# Patient Record
Sex: Female | Born: 2000 | Race: White | Hispanic: No | Marital: Single | Smoking: Never smoker
Health system: Southern US, Community
[De-identification: ages and names within clinical notes are randomized; demographics above are authoritative.]

## PROBLEM LIST (undated history)

## (undated) DIAGNOSIS — J309 Allergic rhinitis, unspecified: Secondary | ICD-10-CM

## (undated) HISTORY — DX: Allergic rhinitis, unspecified: J30.9

---
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
## 2023-01-31 ENCOUNTER — Ambulatory Visit (INDEPENDENT_AMBULATORY_CARE_PROVIDER_SITE_OTHER): Payer: 59 | Admitting: Registered Nurse

## 2023-01-31 ENCOUNTER — Encounter: Payer: Self-pay | Admitting: Registered Nurse

## 2023-01-31 ENCOUNTER — Other Ambulatory Visit: Payer: Self-pay

## 2023-01-31 VITALS — BP 110/72 | HR 73 | Temp 98.8°F | Ht 65.75 in | Wt 186.0 lb

## 2023-01-31 DIAGNOSIS — H6993 Unspecified Eustachian tube disorder, bilateral: Secondary | ICD-10-CM

## 2023-01-31 DIAGNOSIS — J00 Acute nasopharyngitis [common cold]: Secondary | ICD-10-CM | POA: Diagnosis not present

## 2023-01-31 MED ORDER — FLUTICASONE PROPIONATE 50 MCG/ACT NA SUSP: 1.0000 | Freq: Two times a day (BID) | NASAL | 0 refills | Status: DC

## 2023-01-31 MED ORDER — PSEUDOEPHEDRINE HCL 30 MG PO TABS: 30.0000 mg | ORAL_TABLET | ORAL | 0 refills | Status: DC | PRN

## 2023-01-31 MED ORDER — SALINE SPRAY 0.65 % NA SOLN: 2.0000 | NASAL | 0 refills | Status: DC

## 2023-01-31 NOTE — Progress Notes (Signed)
Subjective:    Patient ID: Natalie Santos, female    DOB: 21-Oct-2001, 22 y.o.   MRN: [id number]  22y/o new caucasian single female here for evaluation 1 week ear fullness stopped popping a couple days ago history of PE tubes has fallen out and recurrent ear infections; denied discharge/fever/chills. Augment upset stomach.  Taking zyrtec am and xyzal pm for allergies.  Still having post nasal drip.  Has not started nose sprays grew up in [place], [place] gets seasonal allergies.      Review of Systems  Constitutional:  Negative for chills, fatigue and fever.  HENT:  Positive for congestion, postnasal drip, rhinorrhea and sinus pressure. Negative for dental problem, drooling, ear discharge, ear pain, facial swelling, hearing loss, sinus pain, sneezing, sore throat, tinnitus, trouble swallowing and voice change.   Eyes:  Negative for photophobia, pain, discharge, redness, itching and visual disturbance.  Respiratory:  Negative for cough, shortness of breath, wheezing and stridor.   Cardiovascular:  Negative for chest pain.  Gastrointestinal:  Negative for diarrhea, nausea and vomiting.  Genitourinary:  Negative for difficulty urinating.  Musculoskeletal:  Negative for back pain, gait problem, neck pain and neck stiffness.  Skin:  Negative for rash.  Allergic/Immunologic: Positive for environmental allergies.  Neurological:  Negative for dizziness, tremors, seizures, syncope, facial asymmetry, speech difficulty, weakness, light-headedness, numbness and headaches.  Psychiatric/Behavioral:  Negative for agitation, confusion and sleep disturbance.        Objective:   Physical Exam Vitals and nursing note reviewed.  Constitutional:      General: She is not in acute distress.    Appearance: Normal appearance. She is well-developed. She is not ill-appearing, toxic-appearing or diaphoretic.  HENT:     Head: Normocephalic and atraumatic.     Jaw: There is normal jaw occlusion.     Salivary  Glands: Right salivary gland is not diffusely enlarged or tender. Left salivary gland is not diffusely enlarged or tender.     Right Ear: Hearing, ear canal and external ear normal. No decreased hearing noted. No laceration, drainage, swelling or tenderness. A middle ear effusion is present. There is no impacted cerumen. No foreign body. No mastoid tenderness. No PE tube. No hemotympanum. Tympanic membrane is not injected, scarred, perforated, erythematous, retracted or bulging.     Left Ear: Hearing, ear canal and external ear normal. No decreased hearing noted. No laceration, drainage, swelling or tenderness. A middle ear effusion is present. There is no impacted cerumen. No foreign body. No mastoid tenderness. No PE tube. No hemotympanum. Tympanic membrane is not injected, scarred, perforated, erythematous, retracted or bulging.     Nose: Mucosal edema, congestion and rhinorrhea present. No septal deviation, laceration or nasal tenderness. Rhinorrhea is clear.     Right Turbinates: Enlarged and swollen. Not pale.     Left Turbinates: Enlarged and swollen. Not pale.     Right Sinus: Frontal sinus tenderness present. No maxillary sinus tenderness.     Left Sinus: Frontal sinus tenderness present. No maxillary sinus tenderness.     Mouth/Throat:     Lips: Pink. No lesions.     Mouth: Mucous membranes are moist. No angioedema.     Dentition: No gum lesions.     Tongue: No lesions. Tongue does not deviate from midline.     Palate: No mass and lesions.     Pharynx: Uvula midline. Pharyngeal swelling and posterior oropharyngeal erythema present. No oropharyngeal exudate or uvula swelling.     Tonsils: No tonsillar exudate or  tonsillar abscesses. 1+ on the right. 1+ on the left.     Comments: Tonsils edema erythema without exudate; cobblestoning posterior pharynx; oropharynx macular erythema; bilateral allergic shiners; bilateral TMs intact air fluid level 10% opacity; clear discharge bilateral nasal  turbinates edema erythema; nasal congestion Eyes:     General: Lids are normal. Vision grossly intact. Gaze aligned appropriately. Allergic shiner present. No scleral icterus.       Right eye: No discharge.        Left eye: No discharge.     Extraocular Movements: Extraocular movements intact.     Conjunctiva/sclera: Conjunctivae normal.     Pupils: Pupils are equal, round, and reactive to light.  Neck:     Trachea: Trachea and phonation normal. No tracheal deviation.  Cardiovascular:     Rate and Rhythm: Normal rate and regular rhythm.     Pulses: Normal pulses.          Radial pulses are 2+ on the right side and 2+ on the left side.     Heart sounds: Normal heart sounds, S1 normal and S2 normal.  Pulmonary:     Effort: Pulmonary effort is normal. No respiratory distress.     Breath sounds: Normal breath sounds and air entry. No stridor, decreased air movement or transmitted upper airway sounds. No decreased breath sounds, wheezing, rhonchi or rales.     Comments: Spoke full sentences without difficulty; no cough observed in exam room Abdominal:     Palpations: Abdomen is soft.  Musculoskeletal:        General: Normal range of motion.     Right hand: Normal strength. Normal capillary refill.     Left hand: Normal strength. Normal capillary refill.     Cervical back: Normal range of motion and neck supple. No swelling, edema, deformity, erythema, signs of trauma, lacerations, rigidity, torticollis, tenderness or crepitus. No pain with movement. Normal range of motion.  Lymphadenopathy:     Head:     Right side of head: No submental, submandibular, tonsillar, preauricular, posterior auricular or occipital adenopathy.     Left side of head: No submental, submandibular, tonsillar, preauricular, posterior auricular or occipital adenopathy.     Cervical: No cervical adenopathy.     Right cervical: No superficial, deep or posterior cervical adenopathy.    Left cervical: No superficial, deep  or posterior cervical adenopathy.  Skin:    General: Skin is warm and dry.     Capillary Refill: Capillary refill takes less than 2 seconds.     Coloration: Skin is not ashen, cyanotic, jaundiced, mottled, pale or sallow.     Findings: No bruising or erythema.  Neurological:     General: No focal deficit present.     Mental Status: She is alert and oriented to person, place, and time. Mental status is at baseline.     GCS: GCS eye subscore is 4. GCS verbal subscore is 5. GCS motor subscore is 6.     Cranial Nerves: Cranial nerves 2-12 are intact. No cranial nerve deficit, dysarthria or facial asymmetry.     Motor: Motor function is intact. No weakness, tremor, abnormal muscle tone or seizure activity.     Coordination: Coordination is intact. Coordination normal.     Gait: Gait is intact. Gait normal.     Comments: On/off exam table without difficulty; gait sure and steady in clinic; bilateral hand strength equal 5/5  Psychiatric:        Attention and Perception: Attention and perception  normal.        Mood and Affect: Mood and affect normal.        Speech: Speech normal.        Behavior: Behavior normal. Behavior is cooperative.        Thought Content: Thought content normal.        Cognition and Memory: Cognition and memory normal.        Judgment: Judgment normal.           Assessment & Plan:  A-acute rhinitis, eustachian tube dysfunction bilateral  P-Patient may use normal saline nasal spray 2 sprays each nostril q2h wa as needed. flonase 65mg 1 spray each nostril BID OTC.  Use nasal saline first then flonase 10-5 minutes after. Stop muinex as not needed if runny mucous/worsens post nasal drip.  Start sudafed '30mg'$  take 1-2 po q4-6h prn rhinitis/congestion.  Discussed may cause drowsiness avoid alcohol/driving after taking until she knows how she responds.  Show drivers license at pharmacy to obtain.   OTC antihistamine of choice zyrtec '10mg'$  po daily.  Consider singulair '10mg'$  po  qhs if no relief with zyrtec and flonase use.  Discussed many viruses still circulating in community and spring pollen levels high especially tree at this time.  Zyrtec/xyzal does not typically help viral drip but flonase and sudafed will.   Avoid triggers if possible.  Shower prior to bedtime if exposed to triggers.  If allergic dust/dust mites recommend mattress/pillow covers/encasements; washing linens, vacuuming, sweeping, dusting weekly.  Call or return to clinic as needed if these symptoms worsen or fail to improve as anticipated.   Exitcare handouts on nonallergic rhinitis, allergic rhinitis and sinus rinse sent to patient my chart.  Patient verbalized understanding of instructions, agreed with plan of care and had no further questions at this time.  P2:  Avoidance and hand washing.    No evidence of invasive bacterial infection, non toxic and well hydrated.  I do not see where any further testing or imaging is necessary at this time.   I will suggest supportive care, rest, good hygiene and encourage the patient to take adequate fluids.  The patient is to return to clinic or EMERGENCY ROOM if symptoms worsen or change significantly e.g. ear pain, fever, purulent discharge from ears or bleeding.  Exitcare handout on Eustachian tube dysfunction sent to my chart.  Discussed with patient post nasal drip irritates throat/causes swelling blocks eustachian tubes from draining and fluid fills up middle ear.  Bacteria/viruses can grow in fluid and with moving head tube compressed and increases pressure in tube/ear worsening pain.  Studies show will take 30 days for fluid to resolve after post nasal drip controlled with nasal steroid/antihistamine. Antibiotics and steroids do not speed up fluid removal.  Patient verbalized agreement and understanding of treatment plan and had no further questions at this time.

## 2023-01-31 NOTE — Patient Instructions (Signed)
Eustachian Tube Dysfunction  Eustachian tube dysfunction refers to a condition in which a blockage develops in the narrow passage that connects the middle ear to the back of the nose (eustachian tube). The eustachian tube regulates air pressure in the middle ear by letting air move between the ear and nose. It also helps to drain fluid from the middle ear space. Eustachian tube dysfunction can affect one or both ears. When the eustachian tube does not function properly, air pressure, fluid, or both can build up in the middle ear. What are the causes? This condition occurs when the eustachian tube becomes blocked or cannot open normally. Common causes of this condition include: Ear infections. Colds and other infections that affect the nose, mouth, and throat (upper respiratory tract). Allergies. Irritation from cigarette smoke. Irritation from stomach acid coming up into the esophagus (gastroesophageal reflux). The esophagus is the part of the body that moves food from the mouth to the stomach. Sudden changes in air pressure, such as from descending in an airplane or scuba diving. Abnormal growths in the nose or throat, such as: Growths that line the nose (nasal polyps). Abnormal growth of cells (tumors). Enlarged tissue at the back of the throat (adenoids). What increases the risk? You are more likely to develop this condition if: You smoke. You are overweight. You are a child who has: Certain birth defects of the mouth, such as cleft palate. Large tonsils or adenoids. What are the signs or symptoms? Common symptoms of this condition include: A feeling of fullness in the ear. Ear pain. Clicking or popping noises in the ear. Ringing in the ear (tinnitus). Hearing loss. Loss of balance. Dizziness. Symptoms may get worse when the air pressure around you changes, such as when you travel to an area of high elevation, fly on an airplane, or go scuba diving. How is this diagnosed? This  condition may be diagnosed based on: Your symptoms. A physical exam of your ears, nose, and throat. Tests, such as those that measure: The movement of your eardrum. Your hearing (audiometry). How is this treated? Treatment depends on the cause and severity of your condition. In mild cases, you may relieve your symptoms by moving air into your ears. This is called "popping the ears." In more severe cases, or if you have symptoms of fluid in your ears, treatment may include: Medicines to relieve congestion (decongestants). Medicines that treat allergies (antihistamines). Nasal sprays or ear drops that contain medicines that reduce swelling (steroids). A procedure to drain the fluid in your eardrum. In this procedure, a small tube may be placed in the eardrum to: Drain the fluid. Restore the air in the middle ear space. A procedure to insert a balloon device through the nose to inflate the opening of the eustachian tube (balloon dilation). Follow these instructions at home: Lifestyle Do not do any of the following until your health care provider approves: Travel to high altitudes. Fly in airplanes. Work in a pressurized cabin or room. Scuba dive. Do not use any products that contain nicotine or tobacco. These products include cigarettes, chewing tobacco, and vaping devices, such as e-cigarettes. If you need help quitting, ask your health care provider. Keep your ears dry. Wear fitted earplugs during showering and bathing. Dry your ears completely after. General instructions Take over-the-counter and prescription medicines only as told by your health care provider. Use techniques to help pop your ears as recommended by your health care provider. These may include: Chewing gum. Yawning. Frequent, forceful swallowing.   Closing your mouth, holding your nose closed, and gently blowing as if you are trying to blow air out of your nose. Keep all follow-up visits. This is important. Contact a  health care provider if: Your symptoms do not go away after treatment. Your symptoms come back after treatment. You are unable to pop your ears. You have: A fever. Pain in your ear. Pain in your head or neck. Fluid draining from your ear. Your hearing suddenly changes. You become very dizzy. You lose your balance. Get help right away if: You have a sudden, severe increase in any of your symptoms. Summary Eustachian tube dysfunction refers to a condition in which a blockage develops in the eustachian tube. It can be caused by ear infections, allergies, inhaled irritants, or abnormal growths in the nose or throat. Symptoms may include ear pain or fullness, hearing loss, or ringing in the ears. Mild cases are treated with techniques to unblock the ears, such as yawning or chewing gum. More severe cases are treated with medicines or procedures. This information is not intended to replace advice given to you by your health care provider. Make sure you discuss any questions you have with your health care provider. Document Revised: 01/22/2021 Document Reviewed: 01/22/2021 Elsevier Patient Education  [place]. Allergic Rhinitis, Adult  Allergic rhinitis is an allergic reaction that affects the mucous membrane inside the nose. The mucous membrane is the tissue that produces mucus. There are two types of allergic rhinitis: Seasonal. This type is also called hay fever and happens only during certain seasons. Perennial. This type can happen at any time of the year. Allergic rhinitis cannot be spread from person to person. This condition can be mild, bad, or very bad. It can develop at any age and may be outgrown. What are the causes? This condition is caused by allergens. These are things that can cause an allergic reaction. Allergens may differ for seasonal allergic rhinitis and perennial allergic rhinitis. Seasonal allergic rhinitis is caused by pollen. Pollen can come from grasses,  trees, and weeds. Perennial allergic rhinitis may be caused by: Dust mites. Proteins in a pet's pee (urine), saliva, or dander. Dander is dead skin cells from a pet. Smoke, mold, or car fumes. Remains of or waste from insects such as cockroaches. What increases the risk? You are more likely to develop this condition if you have a family history of allergies or other conditions related to allergies, including: Allergic conjunctivitis. This is irritation and swelling of parts of the eyes and eyelids. Asthma. This condition affects the lungs and makes it hard to breathe. Atopic dermatitis or eczema. This is long term (chronic) irritation and swelling of the skin. Food allergies. What are the signs or symptoms? Symptoms of this condition include: Sneezing or coughing. A stuffy nose (nasal congestion), itchy nose, or nasal discharge. Itchy eyes and tearing of the eyes. A feeling of mucus dripping down the back of your throat (postnasal drip). This may cause a sore throat. Trouble sleeping. Tiredness. Headache. How is this diagnosed? This condition may be diagnosed with your symptoms, your medical history, and a physical exam. Your health care provider may check for related conditions, such as: Asthma. Pink eye. This is eye swelling and irritation caused by infection (conjunctivitis). Ear infection. Upper respiratory infection. This is an infection in the nose, throat, or upper airways. You may also have tests to find out which allergens cause your symptoms. These may include skin tests or blood tests. How is this  treated? There is no cure for this condition, but treatment can help control symptoms. Treatment may include: Taking medicines that block allergy symptoms, such as corticosteroids (anti-inflammatories) and antihistamines. Medicine may be given as a shot, nasal spray, or pill. Avoiding any allergens. Being exposed again and again to tiny amounts of allergens to help you build a  defense against allergens (allergenimmunotherapy). This is done if other treatments have not helped. It may include: Allergy shots. These are injected medicines that have small amounts of an allergen in them. Sublingual immunotherapy. This involves taking small doses of a medicine with an allergen in it under your tongue. If these treatments do not work, your provider may prescribe newer, stronger medicines. Follow these instructions at home: Avoiding allergens Find out what you are allergic to and avoid those allergens. These are some things you can do to help avoid allergens: If you have perennial allergies: Replace carpet with wood, tile, or vinyl flooring. Carpet can trap dander and dust. Do not smoke. Do not allow smoking in your home Change your heating and air conditioning filters at least once a month. If you have seasonal allergies, take these steps during allergy season: Keep windows closed as much as possible. Plan outdoor activities when pollen counts are lowest. Check pollen counts before you plan outdoor activities When coming indoors, change clothing and shower before sitting on furniture or bedding. If you have a pet in the house that produces allergens: Keep the pet out of the bedroom. Vacuum, sweep, and dust regularly. General instructions Take over-the-counter and prescription medicines only as told by your provider. Drink enough fluid to keep your pee pale yellow. Where to find more information American Academy of Allergy, Asthma & Immunology: aaaai.org Contact a health care provider if: You have a fever. You develop a cough that does not go away. You make high-pitched whistling sounds when you breathe, most often when you breathe out (wheeze). Your symptoms slow you down or stop you from doing your normal activities each day. Get help right away if: You have shortness of breath. This symptom may be an emergency. Get help right away. Call 911. Do not wait to see if  the symptoms will go away. Do not drive yourself to the hospital. This information is not intended to replace advice given to you by your health care provider. Make sure you discuss any questions you have with your health care provider. Document Revised: 07/22/2022 Document Reviewed: 07/22/2022 Elsevier Patient Education  [place]. How to Perform a Sinus Rinse A sinus rinse is a home treatment that is used to rinse your sinuses with a germ-free (sterile) mixture of salt and water (saline solution). Sinuses are air-filled spaces in your skull that are behind the bones of your face and forehead. They open into your nasal cavity. A sinus rinse can help to clear mucus, dirt, dust, or pollen from your nasal cavity. You may do a sinus rinse when you have a cold, a virus, nasal allergy symptoms, a sinus infection, or stuffiness in your nose or sinuses. What are the risks? A sinus rinse is generally safe and effective. However, there are a few risks, which include: A burning sensation in your sinuses. This may happen if you do not make the saline solution as directed. Be sure to follow all directions when making the saline solution. Nasal irritation. Infection. This may be from unclean supplies or from contaminated water. Infection from contaminated water is rare, but possible. Do not do a sinus  rinse if you have had ear or nasal surgery, ear infection, or plugged ears, unless recommended by your health care provider. Supplies needed: Saline solution or powder. Distilled or sterile water to mix with saline powder. You may use boiled and cooled tap water. Boil tap water for 5 minutes; cool until it is lukewarm. Use within 24 hours. Do not use regular tap water to mix with the saline solution. Neti pot or nasal rinse bottle. These supplies release the saline solution into your nose and through your sinuses. Neti pots and nasal rinse bottles can be purchased at [employer], a health food  store, or online. How to perform a sinus rinse  Wash your hands with soap and water for at least 20 seconds. If soap and water are not available, use hand sanitizer. Wash your device according to the directions that came with the product and then dry it. Use the solution that comes with your product or one that is sold separately in stores. Follow the mixing directions on the package to mix with sterile or distilled water. Fill the device with the amount of saline solution noted in the device instructions. Stand by a sink and tilt your head sideways over the sink. Place the spout of the device in your upper nostril (the one closer to the ceiling). Gently pour or squeeze the saline solution into your nasal cavity. The liquid should drain out from the lower nostril if you are not too congested. While rinsing, breathe through your open mouth. Gently blow your nose to clear any mucus and rinse solution. Blowing too hard may cause ear pain. Turn your head in the other direction and repeat in your other nostril. Clean and rinse your device with clean water and then air-dry it. Talk with your health care provider or pharmacist if you have questions about how to do a sinus rinse. Summary A sinus rinse is a home treatment that is used to rinse your sinuses with a sterile mixture of salt and water (saline solution). You may do a sinus rinse when you have a cold, a virus, nasal allergy symptoms, a sinus infection, or stuffiness in your nose or sinuses. A sinus rinse is generally safe and effective. Follow all instructions carefully. This information is not intended to replace advice given to you by your health care provider. Make sure you discuss any questions you have with your health care provider. Document Revised: 04/30/2021 Document Reviewed: 04/30/2021 Elsevier Patient Education  [place]. Nonallergic Rhinitis Nonallergic rhinitis is inflammation of the mucous membrane inside the nose.  The mucous membrane is the tissue that produces mucus. This condition is different from having allergic rhinitis, which is an allergy that affects the nose. Allergic rhinitis occurs when the body's defense system, or immune system, reacts to a substance that a person is allergic to (allergen), such as pollen, pet dander, mold, or dust. Nonallergic rhinitis has many similar symptoms, but it is not caused by allergens. Nonallergic rhinitis can be an acute or chronic problem. This means it can be short-term or long-term. What are the causes? This condition may be caused by many different things. Some common types of nonallergic rhinitis include: Infectious rhinitis. This is usually caused by an infection in the nose, throat, or upper airways (upper respiratory system). Vasomotor rhinitis. This is the most common type. It is caused by too much blood flow through your nose, and makes your nose swell. It is triggered by strong odors, cold air, stress, drinking alcohol,  cigarette smoke, or changes in the weather. Occupational rhinitis. This type is caused by triggers in the workplace, such as chemicals, dust, animal dander, or air pollution. Hormonal rhinitis, in female teens and adults. This type is caused by an increase in the hormone estrogen and may happen during pregnancy, puberty, or monthly menstrual periods. Hormonal rhinitis gives you fewer symptoms when estrogen levels drop. Drug-induced rhinitis. Several types of medicines can cause this, such as medicines for high blood pressure or heart disease, aspirin, or NSAIDs. Nonallergic rhinitis with eosinophilia syndrome (NARES). This type is caused by having too much eosinophil, a type of white blood cell. Other causes include a reaction to eating hot or spicy foods. This does not usually cause long-term symptoms. In some cases, the cause of nonallergic rhinitis is not known. What increases the risk? You are more likely to develop this condition if: You  are 29-57 years of age. You are female. People who are female are twice as likely to have this condition. What are the signs or symptoms? Common symptoms of this condition include: Stuffy nose (nasal congestion). Runny nose. A feeling of mucus dripping down the back of your throat (postnasal drip). Trouble sleeping. Tiredness, or fatigue. Other symptoms include: Sneezing. Coughing. Itchy nose. Bloodshot eyes. How is this diagnosed? This type may be diagnosed based on: Your symptoms and medical history. A physical exam. Allergy testing to rule out allergic rhinitis. You may have skin tests or blood tests. Your health care provider may also take a swab of nasal discharge to look for an increased number of eosinophils. This is done to confirm a diagnosis of NARES. How is this treated? Treatment for this condition depends on the cause. No single treatment works for everyone. Work with your provider to find the best treatment for you. Treatment may include: Avoiding the things that trigger your symptoms. Medicines to relieve congestion, such as: Steroid nasal spray. There are many types. You may need to try a few to find out which one works best. Best boy medicine. This treats nasal congestion and may be given by mouth or as a nasal spray. These medicines are used only for a short time. Medicines to relieve a runny nose. These may include antihistamine medicines or decongestant nasal sprays. Nasal irrigation. This involves using a salt-water (saline) spray or saline container called a neti pot. Nasal irrigation helps to clear away mucus and keep your nasal passages moist. Surgery to remove part of your mucous membrane. This is done in severe cases if the condition has not improved after 6-12 months of treatment. Follow these instructions at home: Medicines Take or use over-the-counter and prescription medicines only as told by your provider. Do not stop using your medicine even if you  start to feel better. Do not take NSAIDs, such as ibuprofen, or medicines that contain aspirin if they make your symptoms worse. Lifestyle Do not drink alcohol if it makes your symptoms worse. Do not use any products that contain nicotine or tobacco. These products include cigarettes, chewing tobacco, and vaping devices, such as e-cigarettes. If you need help quitting, ask your provider. Avoid secondhand smoke. General instructions Avoid triggers that make your symptoms worse. Use nasal irrigation as told by your provider. Sleep with the head of your bed raised. This may reduce nasal congestion when you sleep. Drink enough fluid to keep your pee (urine) pale yellow. Contact a health care provider if: You have a fever. Your symptoms are getting worse at home. Your symptoms do not  lessen with medicine. You develop new symptoms, especially a headache or nosebleed. Get help right away if: You have difficulty breathing. This symptom may be an emergency. Get help right away. Call 911. Do not wait to see if the symptoms will go away. Do not drive yourself to the hospital. This information is not intended to replace advice given to you by your health care provider. Make sure you discuss any questions you have with your health care provider. Document Revised: 07/16/2022 Document Reviewed: 07/16/2022 Elsevier Patient Education  [place].

## 2023-02-13 ENCOUNTER — Other Ambulatory Visit: Payer: Self-pay

## 2023-02-13 ENCOUNTER — Ambulatory Visit (INDEPENDENT_AMBULATORY_CARE_PROVIDER_SITE_OTHER): Payer: 59 | Admitting: Medical

## 2023-02-13 VITALS — BP 112/72 | HR 89 | Temp 98.9°F | Wt 186.0 lb

## 2023-02-13 DIAGNOSIS — H6502 Acute serous otitis media, left ear: Secondary | ICD-10-CM | POA: Diagnosis not present

## 2023-02-13 DIAGNOSIS — J0100 Acute maxillary sinusitis, unspecified: Secondary | ICD-10-CM

## 2023-02-13 MED ORDER — AMOXICILLIN-POT CLAVULANATE 875-125 MG PO TABS: 1.0000 | ORAL_TABLET | Freq: Two times a day (BID) | ORAL | 0 refills | Status: DC

## 2023-02-13 NOTE — Progress Notes (Signed)
[place] [place],  [address] Phone: [phone] Fax: [phone]   Office Visit Note  Patient Name: Natalie Santos  Date of [id number]  Med Rec number [id number]  Date of Service: 02/13/2023  Allergies: Cefdinir and Other  Chief Complaint  Patient presents with   Ear Pain     HPI 22 y.o. college student presents with possible ear infection.  Has been dealing with allergies, seem worse than usual this year.   Taking Xyzal and Zyrtec once daily. Not using Flonase, has not picked up yet. Not using Mucinex. Used Sudafed some after last visit, felt some better, so stopped it.  Congestion worsened 2-3 days ago in ears and sinuses. Some PND, not expectorating this. Yellow-green nasal d/c since yesterday. Concerned mostly about left ear. Will be flying in 2 days to [facility], then going on cruise. Taking Sudafed again last 2 days, helps a litte. Hearing little muffled and ears popping. Mild HA off and on from pressure. No sore throat or cough. No fever or chills.  No known COVID exposures. No analgesic so far.   Hx of frequent sinus infections. Had deviated septum, inflamed turbinate and extra sinus cavity. Had sinus surgery and repair of septum in 2021. Has not had frequent sinus infection since then.   Had Augmentin for 6 weeks in 2019.   Current Medication:  Outpatient Encounter Medications as of 02/13/2023  Medication Sig   cetirizine (ZYRTEC) 10 MG tablet Take 10 mg by mouth daily.   fluticasone (FLONASE) 50 MCG/ACT nasal spray Place 1 spray into both nostrils 2 (two) times daily.   guaiFENesin (MUCINEX) 600 MG 12 hr tablet Take 600 mg by mouth 2 (two) times daily as needed.   hydrOXYzine (ATARAX) 50 MG tablet TAKE 1 TABLET BY MOUTH EVERYDAY AT BEDTIME   levocetirizine (XYZAL) 5 MG tablet Take 5 mg by mouth every evening.   levonorgestrel (KYLEENA) 19.5 MG IUD Take 1 device by intrauterine route.   pseudoephedrine (SUDAFED) 30 MG tablet Take  1-2 tablets (30-60 mg total) by mouth every 4 (four) hours as needed for congestion. Max 8 tabs/240mg  per 24 hours   sodium chloride (OCEAN) 0.65 % SOLN nasal spray Place 2 sprays into both nostrils every 2 (two) hours while awake.   No facility-administered encounter medications on file as of 02/13/2023.      Medical History: Past Medical History:  Diagnosis Date   Allergic rhinitis    Past Surgical History:  Procedure Laterality Date   NASAL SEPTUM SURGERY  02/15/2021   with turbinate resection and resection of concha bullosa     Vital Signs: BP 112/72   Pulse 89   Temp 98.9 F (37.2 C) (Tympanic)   Wt 186 lb (84.4 kg)   SpO2 98%   BMI 30.25 kg/m    Review of Systems See HPI  Physical Exam Vitals reviewed.  Constitutional:      General: She is not in acute distress.    Appearance: She is not ill-appearing.     Comments: Tired appearing  HENT:     Head: Normocephalic.     Right Ear: Ear canal and external ear normal.     Left Ear: Ear canal and external ear normal.     Ears:     Comments: Right TM dull. Left TM only partially visualized due to cerumen in left EAC and narrow canal. Suspect moderate middle ear fluid on left, left TM mildly injected.    Nose: Mucosal edema, congestion  and rhinorrhea present. Rhinorrhea is purulent.     Right Turbinates: Swollen.     Left Turbinates: Swollen.     Right Sinus: Maxillary sinus tenderness and frontal sinus tenderness present.     Left Sinus: Maxillary sinus tenderness and frontal sinus tenderness present.     Comments: Nasal turbinates inflamed appearing bilaterally.     Mouth/Throat:     Mouth: Mucous membranes are moist. No oral lesions.     Pharynx: Posterior oropharyngeal erythema (mild) present. No pharyngeal swelling.     Tonsils: No tonsillar exudate. 0 on the right. 0 on the left.  Cardiovascular:     Rate and Rhythm: Normal rate and regular rhythm.     Heart sounds: No murmur heard.    No friction rub. No  gallop.  Pulmonary:     Effort: Pulmonary effort is normal.     Breath sounds: Normal breath sounds. No wheezing, rhonchi or rales.  Musculoskeletal:     Cervical back: Neck supple. No rigidity.  Lymphadenopathy:     Cervical: Cervical adenopathy (1+ anterior nodes, mildly tender) present.  Neurological:     Mental Status: She is alert.       Assessment/Plan: 1. Acute non-recurrent maxillary sinusitis 2. Non-recurrent acute serous otitis media of left ear Will start Augmentin for sinusitis and otitis media. Advised to continue antihistamines and Sudafed. Add Flonase. May take Ibuprofen as needed for pain/headache. Advised to take dose of Sudafed and Ibuprofen before boarding plane.  - amoxicillin-clavulanate (AUGMENTIN) 875-125 MG tablet; Take 1 tablet by mouth 2 (two) times daily.  Dispense: 20 tablet; Refill: 0  Patient Instructions:  -Take complete course of antibiotics as prescribed.  Take with food. -Rest and stay well hydrated (by drinking water and other liquids).  -Take over-the-counter medicines (i.e. Sudafed, Ibuprofen) to help relieve your symptoms. -Add Flonase/Fluticasone nasal spray, 2 sprays to each nostril once a day. -Continue allergy medicines. -Send MyChart message to provider, schedule return visit after break or visit urgent care over break as needed for new/worsening symptoms (i.e. fever, increased ear/sinus pain) or if symptoms do not improve as discussed with recommended treatment over next 5-7 days.      General Counseling: Natalie Santos verbalizes understanding of the findings of todays visit and agrees with plan of treatment. she has been encouraged to call the office with any questions or concerns that should arise related to todays visit.  Meds ordered this encounter  Medications   amoxicillin-clavulanate (AUGMENTIN) 875-125 MG tablet    Sig: Take 1 tablet by mouth 2 (two) times daily.    Dispense:  20 tablet    Refill:  0    Order Specific Question:    Supervising Provider    Answer:   Dutch Gray [id number]    Time spent:20 [place] PA-C [place] 02/13/2023 9:54 AM

## 2023-02-19 ENCOUNTER — Encounter: Payer: Self-pay | Admitting: Medical

## 2023-02-19 NOTE — Patient Instructions (Signed)
-  Take complete course of antibiotics as prescribed.  Take with food. -Rest and stay well hydrated (by drinking water and other liquids).  -Take over-the-counter medicines (i.e. Sudafed, Ibuprofen) to help relieve your symptoms. -Add Flonase/Fluticasone nasal spray, 2 sprays to each nostril once a day. -Continue allergy medicines. -Send MyChart message to provider, schedule return visit after break or visit urgent care over break as needed for new/worsening symptoms (i.e. fever, increased ear/sinus pain) or if symptoms do not improve as discussed with recommended treatment over next 5-7 days.

## 2023-12-09 ENCOUNTER — Ambulatory Visit (INDEPENDENT_AMBULATORY_CARE_PROVIDER_SITE_OTHER): Payer: 59 | Admitting: Adult Health

## 2023-12-09 ENCOUNTER — Encounter: Payer: Self-pay | Admitting: Adult Health

## 2023-12-09 VITALS — HR 79 | Temp 98.2°F | Ht 65.0 in | Wt 188.0 lb

## 2023-12-09 DIAGNOSIS — J0110 Acute frontal sinusitis, unspecified: Secondary | ICD-10-CM | POA: Diagnosis not present

## 2023-12-09 MED ORDER — DOXYCYCLINE HYCLATE 100 MG PO TABS: 100.0000 mg | ORAL_TABLET | Freq: Two times a day (BID) | ORAL | 0 refills | Status: DC

## 2023-12-09 NOTE — Progress Notes (Signed)
 [facility] [address] [place], [place] [address] Phone: [phone] Fax: [phone]   Office Visit Note  Patient Name: Natalie Santos  Date of [id number]  Med Rec number [id number]  Date of Service: 12/09/2023  Cefdinir and Other  Chief Complaint  Patient presents with   Acute Visit     HPI  Patient is here reporting sinus symptoms for about 2 weeks.  She initially thought it was allergies.  Now she is having PNd, sore throat, sinus pressure, and congestion.  She has a history of sinus infections, and sinus surgery.  Denies any sick contacts.  She took dayquil with very mild improvement.   Current Medication:  Outpatient Encounter Medications as of 12/09/2023  Medication Sig   cetirizine (ZYRTEC) 10 MG tablet Take 10 mg by mouth daily.   doxycycline  (VIBRA -TABS) 100 MG tablet Take 1 tablet (100 mg total) by mouth 2 (two) times daily.   hydrOXYzine (ATARAX) 50 MG tablet TAKE 1 TABLET BY MOUTH EVERYDAY AT BEDTIME   levonorgestrel (KYLEENA) 19.5 MG IUD Take 1 device by intrauterine route.   fluticasone  (FLONASE ) 50 MCG/ACT nasal spray Place 1 spray into both nostrils 2 (two) times daily.   guaiFENesin (MUCINEX) 600 MG 12 hr tablet Take 600 mg by mouth 2 (two) times daily as needed. (Patient not taking: Reported on 12/09/2023)   levocetirizine (XYZAL) 5 MG tablet Take 5 mg by mouth every evening. (Patient not taking: Reported on 12/09/2023)   pseudoephedrine  (SUDAFED) 30 MG tablet Take 1-2 tablets (30-60 mg total) by mouth every 4 (four) hours as needed for congestion. Max 8 tabs/240mg  per 24 hours (Patient not taking: Reported on 12/09/2023)   sodium chloride (OCEAN) 0.65 % SOLN nasal spray Place 2 sprays into both nostrils every 2 (two) hours while awake.   [DISCONTINUED] amoxicillin -clavulanate (AUGMENTIN ) 875-125 MG tablet Take 1 tablet by mouth 2 (two) times daily. (Patient not taking: Reported on 12/09/2023)   No facility-administered encounter medications on  file as of 12/09/2023.      Medical History: Past Medical History:  Diagnosis Date   Allergic rhinitis      Vital Signs: Pulse 79   Temp 98.2 F (36.8 C) (Tympanic)   Ht 5' 5 (1.651 m)   Wt 188 lb (85.3 kg)   SpO2 100%   BMI 31.28 kg/m    Review of Systems  Constitutional:  Negative for chills, fatigue and fever.  HENT:  Positive for congestion, postnasal drip, rhinorrhea, sinus pressure, sinus pain and sore throat.   Eyes:  Negative for pain and itching.  Respiratory:  Positive for cough.   Cardiovascular:  Negative for chest pain.  Gastrointestinal:  Negative for diarrhea, nausea and vomiting.    Physical Exam Vitals reviewed.  HENT:     Head: Normocephalic.     Right Ear: Tympanic membrane and ear canal normal.     Left Ear: Tympanic membrane and ear canal normal.     Nose:     Right Sinus: Frontal sinus tenderness present. No maxillary sinus tenderness.     Left Sinus: Frontal sinus tenderness present. No maxillary sinus tenderness.     Mouth/Throat:     Mouth: Mucous membranes are moist.  Eyes:     Pupils: Pupils are equal, round, and reactive to light.  Neurological:     Mental Status: She is alert.    Assessment/Plan: 1. Acute non-recurrent frontal sinusitis (Primary) Patient Instructions: -Take complete course of antibiotics as prescribed.  Take with food.  -Try  Flonase /Fluticasone  nasal spray, 2 sprays to each nostril once a day. -You can try using a neti pot or nasal saline rinse product to help clear mucus congestion. -Rest and stay well hydrated (by drinking water and other liquids). Avoid/limit caffeine. -Take over-the-counter medicines (i.e. Mucinex, decongestant, Ibuprofen or Tylenol, cough suppressant) to help relieve your symptoms. -For your cough, use cough drops/throat lozenges, gargle warm salt water and/or drink warm liquids (like tea with honey). -Send my chart message to provider or schedule return visit as needed for new/worsening  symptoms or if symptoms do not improve as discussed with antibiotic and other recommended treatment.   - doxycycline  (VIBRA -TABS) 100 MG tablet; Take 1 tablet (100 mg total) by mouth 2 (two) times daily.  Dispense: 20 tablet; Refill: 0     General Counseling: Chaelyn verbalizes understanding of the findings of todays visit and agrees with plan of treatment. I have discussed any further diagnostic evaluation that may be needed or ordered today. We also reviewed her medications today. she has been encouraged to call the office with any questions or concerns that should arise related to todays visit.   No orders of the defined types were placed in this encounter.   Meds ordered this encounter  Medications   doxycycline  (VIBRA -TABS) 100 MG tablet    Sig: Take 1 tablet (100 mg total) by mouth 2 (two) times daily.    Dispense:  20 tablet    Refill:  0    Time spent:15 Minutes Time spent includes review of chart, medications, test results, and follow up plan with the patient.    Juliene DOROTHA Howells AGNP-C Nurse Practitioner

## 2024-01-12 ENCOUNTER — Ambulatory Visit
Admission: EM | Admit: 2024-01-12 | Discharge: 2024-01-12 | Disposition: A | Discharge status: Home / Self Care | Payer: 59

## 2024-01-12 DIAGNOSIS — J069 Acute upper respiratory infection, unspecified: Secondary | ICD-10-CM

## 2024-01-12 LAB — POC COVID19/FLU A&B COMBO
Covid Antigen, POC: NEGATIVE
Influenza A Antigen, POC: NEGATIVE
Influenza B Antigen, POC: NEGATIVE

## 2024-01-12 LAB — POCT RAPID STREP A (OFFICE): Rapid Strep A Screen: NEGATIVE

## 2024-01-12 NOTE — Discharge Instructions (Addendum)
 The strep, COVID and flu tests are negative.   Take Tylenol or ibuprofen as needed for fever or discomfort.  Take plain Mucinex as needed for congestion.  Rest and keep yourself hydrated.    Follow-up with your primary care provider if your symptoms are not improving.

## 2024-01-12 NOTE — ED Triage Notes (Signed)
 Patient to Urgent Care with complaints of cough/ headaches/ sinus pain/ nasal congestion/ body aches/ chills.   Symptoms started Sunday. Exposed to walking pneumonia/ Flu.  Meds: Dayquil (11am)/ Advil.

## 2024-01-12 NOTE — ED Provider Notes (Signed)
 [facility]    CSN: [id number] Arrival date & time: 01/12/24  1730      History   Chief Complaint Chief Complaint  Patient presents with   Cough   Headache    HPI Odelle Kosier is a 23 y.o. female.  Patient presents with chills, body aches, headache, congestion, sore throat, cough since last night.  She took DayQuil this morning.  No fever or shortness of breath.  Patient was seen at [facility] on 12/09/2023; diagnosed with sinusitis; treated with doxycycline.  The history is provided by the patient and medical records.    Past Medical History:  Diagnosis Date   Allergic rhinitis     Patient Active Problem List   Diagnosis Date Noted   Nasal turbinate hypertrophy 11/23/2018   Deviated septum 11/04/2018   Acute recurrent pansinusitis 02/17/2018   Chronic nonintractable headache 02/17/2018   Rhinitis, chronic 02/17/2018   Seasonal allergic rhinitis 02/17/2018    Past Surgical History:  Procedure Laterality Date   NASAL SEPTUM SURGERY  02/15/2021   with turbinate resection and resection of concha bullosa   WISDOM TOOTH EXTRACTION  02/2020    OB History   No obstetric history on file.      Home Medications    Prior to Admission medications   Medication Sig Start Date End Date Taking? Authorizing Provider  Ascorbic Acid (VITAMIN C) 1000 MG tablet Take 1,000 mg by mouth daily.   Yes [provider]  fexofenadine (ALLEGRA) 60 MG tablet Take 60 mg by mouth 2 (two) times daily.   Yes [provider]  Multiple Vitamin (MULTIVITAMIN) tablet Take 1 tablet by mouth daily.   Yes [provider]  cetirizine (ZYRTEC) 10 MG tablet Take 10 mg by mouth daily.    [provider]  doxycycline (VIBRA-TABS) 100 MG tablet Take 1 tablet (100 mg total) by mouth 2 (two) times daily. Patient not taking: Reported on 01/12/2024 12/09/23   Johnna Acosta, NP  fluticasone [facility]) 50 MCG/ACT nasal spray Place 1 spray into both  nostrils 2 (two) times daily. Patient not taking: Reported on 01/12/2024 01/31/23 04/01/23  Barbaraann Barthel, NP  guaiFENesin (MUCINEX) 600 MG 12 hr tablet Take 600 mg by mouth 2 (two) times daily as needed. Patient not taking: Reported on 12/09/2023    [provider]  hydrOXYzine (ATARAX) 50 MG tablet TAKE 1 TABLET BY MOUTH EVERYDAY AT BEDTIME    [provider]  levocetirizine (XYZAL) 5 MG tablet Take 5 mg by mouth every evening. Patient not taking: Reported on 12/09/2023    [provider]  levonorgestrel (KYLEENA) 19.5 MG IUD Take 1 device by intrauterine route. 10/03/20   [provider]  pseudoephedrine (SUDAFED) 30 MG tablet Take 1-2 tablets (30-60 mg total) by mouth every 4 (four) hours as needed for congestion. Max 8 tabs/240mg  per 24 hours Patient not taking: Reported on 12/09/2023 01/31/23   Albina Billet A, NP  sodium chloride (OCEAN) 0.65 % SOLN nasal spray Place 2 sprays into both nostrils every 2 (two) hours while awake. 01/31/23 03/02/23  Betancourt, Jarold Song, NP    Family History History reviewed. No pertinent family history.  Social History Social History   Tobacco Use   Smoking status: Never   Smokeless tobacco: Never  Vaping Use   Vaping status: Never Used  Substance Use Topics   Alcohol use: Never   Drug use: Never     Allergies   Cefdinir and Other  Review of Systems Review of Systems  Constitutional:  Positive for chills. Negative for fever.  HENT:  Positive for congestion and sore throat. Negative for ear pain.   Respiratory:  Positive for cough. Negative for shortness of breath.   Gastrointestinal:  Negative for diarrhea and vomiting.  Neurological:  Positive for headaches.     Physical Exam Triage Vital Signs ED Triage Vitals  Encounter Vitals Group     BP 01/12/24 1757 137/82     Systolic BP Percentile --      Diastolic BP Percentile --      Pulse Rate 01/12/24 1757 100     Resp 01/12/24 1757 18     Temp  01/12/24 1757 98.3 F (36.8 C)     Temp src --      SpO2 01/12/24 1757 97 %     Weight --      Height --      Head Circumference --      Peak Flow --      Pain Score 01/12/24 1800 4     Pain Loc --      Pain Education --      Exclude from Growth Chart --    No data found.  Updated Vital Signs BP 137/82   Pulse 100   Temp 98.3 F (36.8 C)   Resp 18   LMP 01/04/2024   SpO2 97%   Visual Acuity Right Eye Distance:   Left Eye Distance:   Bilateral Distance:    Right Eye Near:   Left Eye Near:    Bilateral Near:     Physical Exam Constitutional:      General: She is not in acute distress. HENT:     Right Ear: Tympanic membrane normal.     Left Ear: Tympanic membrane normal.     Nose: Nose normal.     Mouth/Throat:     Mouth: Mucous membranes are moist.     Pharynx: Oropharynx is clear.  Cardiovascular:     Rate and Rhythm: Normal rate and regular rhythm.     Heart sounds: Normal heart sounds.  Pulmonary:     Effort: Pulmonary effort is normal. No respiratory distress.     Breath sounds: Normal breath sounds.  Neurological:     Mental Status: She is alert.      UC Treatments / Results  Labs (all labs ordered are listed, but only abnormal results are displayed) Labs Reviewed  POC COVID19/FLU A&B COMBO  POCT RAPID STREP A (OFFICE)    EKG   Radiology No results found.  Procedures Procedures (including critical care time)  Medications Ordered in UC Medications - No data to display  Initial Impression / Assessment and Plan / UC Course  I have reviewed the triage vital signs and the nursing notes.  Pertinent labs & imaging results that were available during my care of the patient were reviewed by me and considered in my medical decision making (see chart for details).    Viral URI.  Lungs are clear and O2 sat is 97% on room air.  Rapid strep negative.  Rapid COVID and flu negative.  Discussed symptomatic treatment including Tylenol or ibuprofen as  needed for fever or discomfort, plain Mucinex as needed for congestion, rest, hydration.  Instructed patient to follow-up with PCP if not improving.  ED precautions given.  Patient agrees to plan of care.   Final Clinical Impressions(s) / UC Diagnoses   Final diagnoses:  Viral URI  Discharge Instructions      The strep, COVID and flu tests are negative.   Take Tylenol or ibuprofen as needed for fever or discomfort.  Take plain Mucinex as needed for congestion.  Rest and keep yourself hydrated.    Follow-up with your primary care provider if your symptoms are not improving.         ED Prescriptions   None    PDMP not reviewed this encounter.   Mickie Bail, NP 01/12/24 [phone]

## 2024-03-17 ENCOUNTER — Encounter: Payer: Self-pay | Admitting: Adult Health

## 2024-03-17 ENCOUNTER — Ambulatory Visit (INDEPENDENT_AMBULATORY_CARE_PROVIDER_SITE_OTHER): Admitting: Adult Health

## 2024-03-17 VITALS — HR 72 | Temp 99.3°F

## 2024-03-17 DIAGNOSIS — H00024 Hordeolum internum left upper eyelid: Secondary | ICD-10-CM | POA: Diagnosis not present

## 2024-03-17 MED ORDER — GENTAMICIN SULFATE 0.3 % OP SOLN: 1.0000 [drp] | OPHTHALMIC | 0 refills | Status: DC

## 2024-03-17 NOTE — Progress Notes (Signed)
 [facility] [address] [place], [place] [address] Phone: [phone] Fax: [phone]   Office Visit Note  Patient Name: Natalie Santos  Date of [id number]  Med Rec number [id number]  Date of Service: 03/17/2024  Cefdinir and Other  Chief Complaint  Patient presents with   Acute Visit     HPI Patient woke up this morning with left eye swollen shut, red, itchy, and crusty. She took zyrtec which seemed to help some.  Denies fever, chills, headache, or cough.    Current Medication:  Outpatient Encounter Medications as of 03/17/2024  Medication Sig   cetirizine (ZYRTEC) 10 MG tablet Take 10 mg by mouth daily.   gentamicin  (GARAMYCIN ) 0.3 % ophthalmic solution Place 1 drop into the left eye every 4 (four) hours.   hydrOXYzine (ATARAX) 50 MG tablet TAKE 1 TABLET BY MOUTH EVERYDAY AT BEDTIME   levonorgestrel (KYLEENA) 19.5 MG IUD Take 1 device by intrauterine route.   Ascorbic Acid (VITAMIN C) 1000 MG tablet Take 1,000 mg by mouth daily.   doxycycline  (VIBRA -TABS) 100 MG tablet Take 1 tablet (100 mg total) by mouth 2 (two) times daily. (Patient not taking: Reported on 03/17/2024)   fexofenadine (ALLEGRA) 60 MG tablet Take 60 mg by mouth 2 (two) times daily.   fluticasone  (FLONASE ) 50 MCG/ACT nasal spray Place 1 spray into both nostrils 2 (two) times daily. (Patient not taking: Reported on 01/12/2024)   guaiFENesin (MUCINEX) 600 MG 12 hr tablet Take 600 mg by mouth 2 (two) times daily as needed. (Patient not taking: Reported on 03/17/2024)   levocetirizine (XYZAL) 5 MG tablet Take 5 mg by mouth every evening. (Patient not taking: Reported on 12/09/2023)   Multiple Vitamin (MULTIVITAMIN) tablet Take 1 tablet by mouth daily.   pseudoephedrine  (SUDAFED) 30 MG tablet Take 1-2 tablets (30-60 mg total) by mouth every 4 (four) hours as needed for congestion. Max 8 tabs/240mg  per 24 hours (Patient not taking: Reported on 03/17/2024)   sodium chloride (OCEAN) 0.65 % SOLN nasal  spray Place 2 sprays into both nostrils every 2 (two) hours while awake.   No facility-administered encounter medications on file as of 03/17/2024.      Medical History: Past Medical History:  Diagnosis Date   Allergic rhinitis      Vital Signs: Pulse 72   Temp 99.3 F (37.4 C) (Tympanic)   SpO2 99%    Review of Systems  Constitutional:  Negative for chills, fatigue and fever.  HENT:  Negative for congestion and sore throat.   Eyes:  Positive for discharge, redness and itching.  Respiratory:  Negative for cough.   Cardiovascular:  Negative for chest pain.  Gastrointestinal:  Negative for diarrhea, nausea and vomiting.    Physical Exam Vitals reviewed.  Constitutional:      Appearance: Normal appearance.  HENT:     Head: Normocephalic.     Right Ear: Tympanic membrane and ear canal normal.     Left Ear: Tympanic membrane and ear canal normal.     Nose: Nose normal.  Eyes:     General: Lids are normal. Vision grossly intact.        Right eye: No foreign body or discharge.        Left eye: No foreign body or discharge.     Conjunctiva/sclera:     Right eye: Right conjunctiva is not injected. No chemosis.    Left eye: Left conjunctiva is injected. No exudate.  Lymphadenopathy:     Cervical: No cervical  adenopathy.  Neurological:     Mental Status: She is alert.    Assessment/Plan: 1. Hordeolum internum of left upper eyelid (Primary) Use Gentamicin  eye drops, 1 drop to affected eye(s) every 4 hours while awake.  Use them until symptosm resolve.  Wash hands frequently, and avoid touching eyes.   - gentamicin  (GARAMYCIN ) 0.3 % ophthalmic solution; Place 1 drop into the left eye every 4 (four) hours.  Dispense: 5 mL; Refill: 0        General Counseling: Renesha verbalizes understanding of the findings of todays visit and agrees with plan of treatment. I have discussed any further diagnostic evaluation that may be needed or ordered today. We also reviewed her  medications today. she has been encouraged to call the office with any questions or concerns that should arise related to todays visit.   No orders of the defined types were placed in this encounter.   Meds ordered this encounter  Medications   gentamicin  (GARAMYCIN ) 0.3 % ophthalmic solution    Sig: Place 1 drop into the left eye every 4 (four) hours.    Dispense:  5 mL    Refill:  0    Time spent:15 Minutes Time spent includes review of chart, medications, test results, and follow up plan with the patient.    Sheria Dills AGNP-C Nurse Practitioner

## 2024-04-15 ENCOUNTER — Ambulatory Visit (INDEPENDENT_AMBULATORY_CARE_PROVIDER_SITE_OTHER): Admitting: Medical

## 2024-04-15 ENCOUNTER — Encounter: Payer: Self-pay | Admitting: Medical

## 2024-04-15 VITALS — HR 94 | Temp 98.7°F

## 2024-04-15 DIAGNOSIS — R0982 Postnasal drip: Secondary | ICD-10-CM | POA: Diagnosis not present

## 2024-04-15 DIAGNOSIS — J029 Acute pharyngitis, unspecified: Secondary | ICD-10-CM | POA: Diagnosis not present

## 2024-04-15 DIAGNOSIS — H6122 Impacted cerumen, left ear: Secondary | ICD-10-CM | POA: Diagnosis not present

## 2024-04-15 LAB — POCT RAPID STREP A (OFFICE): Rapid Strep A Screen: NEGATIVE

## 2024-04-15 NOTE — Progress Notes (Signed)
 [facility] [address] [place], [place] [address] Phone: [phone] Fax: [phone]   Office Visit Note  Patient Name: Natalie Santos  Date of [id number]  Med Rec number [id number]  Date of Service: 04/15/2024  Allergies: Cefdinir and Other  Chief Complaint  Patient presents with   Acute Visit     HPI 23 y.o. college student presents with ear discomfort and postnasal drip.  Sx began in last couple days. Was painful to swallow overnight last night, better after drinking coffee this AM. No fever or chills. Mild nasal congestion, was around her dog few days ago (has allergy). No unusual runny nose or sneezing. No cough. Does not really feel sick. Ears feel uncomfortable, L>R.  Takes antihistamines. Does not use nasal sprays regularly. Flonase  causes her to sneeze. Took some Advil yesterday.   No known exposures.    Current Medication:  Outpatient Encounter Medications as of 04/15/2024  Medication Sig   Ascorbic Acid (VITAMIN C) 1000 MG tablet Take 1,000 mg by mouth daily.   cetirizine (ZYRTEC) 10 MG tablet Take 10 mg by mouth daily.   fexofenadine (ALLEGRA) 60 MG tablet Take 60 mg by mouth 2 (two) times daily.   hydrOXYzine (ATARAX) 50 MG tablet TAKE 1 TABLET BY MOUTH EVERYDAY AT BEDTIME   levonorgestrel (KYLEENA) 19.5 MG IUD Take 1 device by intrauterine route.   Multiple Vitamin (MULTIVITAMIN) tablet Take 1 tablet by mouth daily.   [DISCONTINUED] doxycycline  (VIBRA -TABS) 100 MG tablet Take 1 tablet (100 mg total) by mouth 2 (two) times daily. (Patient not taking: Reported on 01/12/2024)   [DISCONTINUED] fluticasone  (FLONASE ) 50 MCG/ACT nasal spray Place 1 spray into both nostrils 2 (two) times daily. (Patient not taking: Reported on 01/12/2024)   [DISCONTINUED] gentamicin  (GARAMYCIN ) 0.3 % ophthalmic solution Place 1 drop into the left eye every 4 (four) hours.   [DISCONTINUED] guaiFENesin (MUCINEX) 600 MG 12 hr tablet Take 600 mg by mouth 2 (two) times daily  as needed. (Patient not taking: Reported on 12/09/2023)   [DISCONTINUED] levocetirizine (XYZAL) 5 MG tablet Take 5 mg by mouth every evening. (Patient not taking: Reported on 04/15/2024)   [DISCONTINUED] pseudoephedrine  (SUDAFED) 30 MG tablet Take 1-2 tablets (30-60 mg total) by mouth every 4 (four) hours as needed for congestion. Max 8 tabs/240mg  per 24 hours (Patient not taking: Reported on 12/09/2023)   [DISCONTINUED] sodium chloride (OCEAN) 0.65 % SOLN nasal spray Place 2 sprays into both nostrils every 2 (two) hours while awake.   No facility-administered encounter medications on file as of 04/15/2024.      Medical History: Past Medical History:  Diagnosis Date   Allergic rhinitis      Vital Signs: Pulse 94   Temp 98.7 F (37.1 C) (Tympanic)   SpO2 100%    Review of Systems See HPI  Physical Exam Vitals reviewed.  Constitutional:      General: She is not in acute distress.    Appearance: She is not ill-appearing.  HENT:     Head: Normocephalic.     Right Ear: Ear canal and external ear normal.     Left Ear: Ear canal and external ear normal.     Ears:     Comments: Right TM dull.  Moderate dry cerumen to left EAC which partially obstructs canal. Visible portion of left TM appears intact and not inflamed.    Nose: Congestion present. No mucosal edema or rhinorrhea.     Right Turbinates: Swollen.     Left Turbinates: Swollen.  Mouth/Throat:     Mouth: Mucous membranes are moist. No oral lesions.     Pharynx: No pharyngeal swelling or posterior oropharyngeal erythema.     Tonsils: No tonsillar exudate.  Cardiovascular:     Rate and Rhythm: Normal rate and regular rhythm.     Heart sounds: No murmur heard.    No friction rub. No gallop.  Pulmonary:     Effort: Pulmonary effort is normal.     Breath sounds: Normal breath sounds. No wheezing, rhonchi or rales.  Musculoskeletal:     Cervical back: Neck supple. No rigidity.  Lymphadenopathy:     Cervical: No  cervical adenopathy.  Neurological:     Mental Status: She is alert.    Recent Results (from the past 2160 hours)  POCT rapid strep A     Status: Normal   Collection Time: 04/15/24  9:24 AM  Result Value Ref Range   Rapid Strep A Screen Negative Negative     Assessment/Plan: 1. Postnasal drip (Primary) Likely allergy-related or possibly secondary to early infection. Discussed supportive measures and OTC symptomatic treatment.  2. Impacted cerumen of left ear Moderate amount of cerumen partially obstructing ear canal. With patient's permission, left ear irrigation was attempted by CMA. Patient quickly became tearful/upset and complained of discomfort with procedure. Irrigation attempt was immediately discontinued. Patient reports she has extreme discomfort with idea of water in her ear canal following prior ear infection in child hood. Patient requested provider attempt to remove remaining cerumen in ear canal with ear curette. I was able to remove a small amount of cerumen using curette but elected not to continue as cerumen was in close proximity to tympanic membrane and did not want to risk injury of TM or pushing cerumen further into canal.   Discussed option to use over-the-counter Debrox drops and bulb syringe at home. Advised against using Q-tips as this will likely push cerumen deeper into ear canal.   Discussed that remaining water in ear canal following irrigation should dry over next few hours.  3. Pharyngitis, unspecified etiology Likely due to postnasal drip.  - POCT rapid strep A   Patient Instructions  -Try taking an over-the-counter steroid nasal spray, such as Nasonex or Nasacort, daily to help decrease post-nasal drip. -Continue taking Allegra or Zyrtec once a day.  -You can consider trying Debrox ear drops and using the included bulb syringe with warm water to help remove the remaining wax in your left ear. Do not use Q-tips.  -Rest and stay well hydrated (by  drinking water and other liquids).  -Take over-the-counter medicines (i.e. Sudafed, Ibuprofen) to help relieve your symptoms if needed. -For your sore throat, use cough drops/throat lozenges, gargle warm salt water and/or drink warm liquids (like tea with honey). -Send MyChart message to provider or visit urgent care/primary care as needed for new/worsening symptoms or if symptoms do not improve as discussed with recommended treatment.     General Counseling: Natalie Santos verbalizes understanding of the findings of todays visit. she has been encouraged to call the office with any questions or concerns that should arise related to todays visit.    Time spent:20 Minutes    Ponciano Bristle PA-C [facility] 04/15/2024 9:18 AM

## 2024-04-16 NOTE — Patient Instructions (Signed)
-  Try taking an over-the-counter steroid nasal spray, such as Nasonex or Nasacort, daily to help decrease post-nasal drip. -Continue taking Allegra or Zyrtec once a day.  -You can consider trying Debrox ear drops and using the included bulb syringe with warm water to help remove the remaining wax in your left ear. Do not use Q-tips.  -Rest and stay well hydrated (by drinking water and other liquids).  -Take over-the-counter medicines (i.e. Sudafed, Ibuprofen) to help relieve your symptoms if needed. -For your sore throat, use cough drops/throat lozenges, gargle warm salt water and/or drink warm liquids (like tea with honey). -Send MyChart message to provider or visit urgent care/primary care as needed for new/worsening symptoms or if symptoms do not improve as discussed with recommended treatment.

## 2024-04-26 ENCOUNTER — Ambulatory Visit: Admitting: Adult Health

## 2024-06-11 ENCOUNTER — Encounter: Payer: Self-pay | Admitting: Advanced Practice Midwife
